# Patient Record
Sex: Female | Born: 1975 | Race: Black or African American | Hispanic: No | Marital: Single | Smoking: Current every day smoker
Health system: Southern US, Community
[De-identification: ages and names within clinical notes are randomized; demographics above are authoritative.]

---

## 2002-11-25 ENCOUNTER — Emergency Department (HOSPITAL_COMMUNITY): Admission: AD | Admit: 2002-11-25 | Discharge: 2002-11-25 | Payer: Self-pay | Admitting: Family Medicine

## 2004-02-27 ENCOUNTER — Emergency Department (HOSPITAL_COMMUNITY): Admission: EM | Admit: 2004-02-27 | Discharge: 2004-02-27 | Payer: Self-pay | Admitting: Family Medicine

## 2005-01-11 ENCOUNTER — Emergency Department (HOSPITAL_COMMUNITY): Admission: EM | Admit: 2005-01-11 | Discharge: 2005-01-11 | Payer: Self-pay | Admitting: Emergency Medicine

## 2005-03-09 ENCOUNTER — Emergency Department (HOSPITAL_COMMUNITY): Admission: EM | Admit: 2005-03-09 | Discharge: 2005-03-09 | Payer: Self-pay | Admitting: Emergency Medicine

## 2005-08-11 ENCOUNTER — Emergency Department (HOSPITAL_COMMUNITY): Admission: EM | Admit: 2005-08-11 | Discharge: 2005-08-11 | Payer: Self-pay | Admitting: Family Medicine

## 2007-02-19 ENCOUNTER — Emergency Department (HOSPITAL_COMMUNITY): Admission: EM | Admit: 2007-02-19 | Discharge: 2007-02-19 | Payer: Self-pay | Admitting: Family Medicine

## 2007-04-29 ENCOUNTER — Emergency Department (HOSPITAL_COMMUNITY): Admission: EM | Admit: 2007-04-29 | Discharge: 2007-04-29 | Payer: Self-pay | Admitting: Emergency Medicine

## 2007-04-30 ENCOUNTER — Emergency Department (HOSPITAL_COMMUNITY): Admission: EM | Admit: 2007-04-30 | Discharge: 2007-04-30 | Payer: Self-pay | Admitting: Family Medicine

## 2007-06-01 ENCOUNTER — Emergency Department (HOSPITAL_COMMUNITY): Admission: EM | Admit: 2007-06-01 | Discharge: 2007-06-01 | Payer: Self-pay | Admitting: Emergency Medicine

## 2007-09-18 ENCOUNTER — Emergency Department (HOSPITAL_COMMUNITY): Admission: EM | Admit: 2007-09-18 | Discharge: 2007-09-18 | Payer: Self-pay | Admitting: Family Medicine

## 2007-12-10 ENCOUNTER — Ambulatory Visit: Payer: Self-pay | Admitting: Family Medicine

## 2007-12-10 ENCOUNTER — Encounter (INDEPENDENT_AMBULATORY_CARE_PROVIDER_SITE_OTHER): Payer: Self-pay | Admitting: Family Medicine

## 2007-12-10 ENCOUNTER — Other Ambulatory Visit: Admission: RE | Admit: 2007-12-10 | Discharge: 2007-12-10 | Payer: Self-pay | Admitting: Family Medicine

## 2007-12-10 ENCOUNTER — Encounter: Payer: Self-pay | Admitting: Family Medicine

## 2007-12-10 DIAGNOSIS — N912 Amenorrhea, unspecified: Secondary | ICD-10-CM | POA: Insufficient documentation

## 2007-12-10 LAB — CONVERTED CEMR LAB
BUN: 18 mg/dL (ref 6–23)
Beta hcg, urine, semiquantitative: NEGATIVE
CO2: 22 meq/L (ref 19–32)
Calcium: 9.1 mg/dL (ref 8.4–10.5)
Chloride: 104 meq/L (ref 96–112)
Cholesterol: 190 mg/dL (ref 0–200)
Creatinine, Ser: 0.76 mg/dL (ref 0.40–1.20)
Glucose, Bld: 95 mg/dL (ref 70–99)
HCT: 37 % (ref 36.0–46.0)
HDL: 80 mg/dL (ref >39)
Hemoglobin: 12 g/dL (ref 12.0–15.0)
LDL Cholesterol: 100 mg/dL — ABNORMAL HIGH (ref 0–99)
MCHC: 32.4 g/dL (ref 30.0–36.0)
MCV: 94.6 fL (ref 78.0–100.0)
Platelets: 277 10*3/uL (ref 150–400)
Potassium: 4.7 meq/L (ref 3.5–5.3)
RBC: 3.91 M/uL (ref 3.87–5.11)
RDW: 14.6 % (ref 11.5–15.5)
Sodium: 136 meq/L (ref 135–145)
Total CHOL/HDL Ratio: 2.4
Triglycerides: 48 mg/dL (ref <150)
VLDL: 10 mg/dL (ref 0–40)
WBC: 8.1 10*3/uL (ref 4.0–10.5)

## 2007-12-11 ENCOUNTER — Telehealth: Payer: Self-pay | Admitting: *Deleted

## 2007-12-11 ENCOUNTER — Encounter: Payer: Self-pay | Admitting: Family Medicine

## 2008-01-01 ENCOUNTER — Ambulatory Visit: Payer: Self-pay | Admitting: Family Medicine

## 2008-01-01 ENCOUNTER — Encounter: Payer: Self-pay | Admitting: Family Medicine

## 2008-01-07 ENCOUNTER — Ambulatory Visit: Payer: Self-pay | Admitting: Family Medicine

## 2008-01-13 ENCOUNTER — Encounter: Payer: Self-pay | Admitting: Family Medicine

## 2008-02-11 ENCOUNTER — Emergency Department (HOSPITAL_COMMUNITY): Admission: EM | Admit: 2008-02-11 | Discharge: 2008-02-11 | Payer: Self-pay | Admitting: Emergency Medicine

## 2008-05-27 ENCOUNTER — Encounter: Payer: Self-pay | Admitting: Family Medicine

## 2008-05-27 ENCOUNTER — Ambulatory Visit: Payer: Self-pay | Admitting: Family Medicine

## 2008-05-27 DIAGNOSIS — K5900 Constipation, unspecified: Secondary | ICD-10-CM | POA: Insufficient documentation

## 2008-05-27 LAB — CONVERTED CEMR LAB

## 2008-05-28 ENCOUNTER — Encounter: Payer: Self-pay | Admitting: Family Medicine

## 2008-05-29 ENCOUNTER — Ambulatory Visit: Payer: Self-pay | Admitting: Family Medicine

## 2008-06-09 ENCOUNTER — Telehealth: Payer: Self-pay | Admitting: Family Medicine

## 2008-06-29 ENCOUNTER — Encounter: Payer: Self-pay | Admitting: Family Medicine

## 2008-06-30 ENCOUNTER — Encounter: Payer: Self-pay | Admitting: Family Medicine

## 2008-06-30 ENCOUNTER — Ambulatory Visit: Payer: Self-pay | Admitting: Family Medicine

## 2008-06-30 LAB — CONVERTED CEMR LAB
Beta hcg, urine, semiquantitative: NEGATIVE
TSH: 1.416 microintl units/mL (ref 0.350–4.500)

## 2008-07-01 ENCOUNTER — Encounter: Payer: Self-pay | Admitting: Family Medicine

## 2008-12-31 ENCOUNTER — Telehealth: Payer: Self-pay | Admitting: Family Medicine

## 2009-01-01 ENCOUNTER — Ambulatory Visit: Payer: Self-pay | Admitting: Family Medicine

## 2009-10-26 ENCOUNTER — Emergency Department (HOSPITAL_COMMUNITY)
Admission: EM | Admit: 2009-10-26 | Discharge: 2009-10-26 | Payer: Self-pay | Source: Home / Self Care | Admitting: Emergency Medicine

## 2009-12-16 ENCOUNTER — Emergency Department (HOSPITAL_COMMUNITY)
Admission: EM | Admit: 2009-12-16 | Discharge: 2009-12-16 | Payer: Self-pay | Source: Home / Self Care | Admitting: Emergency Medicine

## 2010-02-21 ENCOUNTER — Encounter: Payer: Self-pay | Admitting: *Deleted

## 2010-09-24 LAB — DIFFERENTIAL
Basophils Absolute: 0
Basophils Relative: 1
Eosinophils Absolute: 0.2
Eosinophils Relative: 3
Lymphocytes Relative: 24
Lymphs Abs: 1.8
Monocytes Absolute: 0.7
Monocytes Relative: 9
Neutro Abs: 4.9
Neutrophils Relative %: 64

## 2010-09-24 LAB — CBC
HCT: 41.5
Hemoglobin: 13.6
MCHC: 32.8
MCV: 93.4
Platelets: 263
RBC: 4.44
RDW: 14.4
WBC: 7.7

## 2010-09-24 LAB — URIC ACID: Uric Acid, Serum: 4.1

## 2012-02-18 ENCOUNTER — Encounter (HOSPITAL_COMMUNITY): Payer: Self-pay | Admitting: *Deleted

## 2012-02-18 ENCOUNTER — Emergency Department (HOSPITAL_COMMUNITY)
Admission: EM | Admit: 2012-02-18 | Discharge: 2012-02-18 | Disposition: A | Discharge status: Home / Self Care | Payer: Self-pay | Attending: Emergency Medicine | Admitting: Emergency Medicine

## 2012-02-18 DIAGNOSIS — S51009A Unspecified open wound of unspecified elbow, initial encounter: Secondary | ICD-10-CM | POA: Insufficient documentation

## 2012-02-18 DIAGNOSIS — S0990XA Unspecified injury of head, initial encounter: Secondary | ICD-10-CM | POA: Insufficient documentation

## 2012-02-18 DIAGNOSIS — Z79899 Other long term (current) drug therapy: Secondary | ICD-10-CM | POA: Insufficient documentation

## 2012-02-18 DIAGNOSIS — Y929 Unspecified place or not applicable: Secondary | ICD-10-CM | POA: Insufficient documentation

## 2012-02-18 DIAGNOSIS — F17200 Nicotine dependence, unspecified, uncomplicated: Secondary | ICD-10-CM | POA: Insufficient documentation

## 2012-02-18 DIAGNOSIS — W19XXXA Unspecified fall, initial encounter: Secondary | ICD-10-CM

## 2012-02-18 DIAGNOSIS — IMO0002 Reserved for concepts with insufficient information to code with codable children: Secondary | ICD-10-CM

## 2012-02-18 DIAGNOSIS — Y939 Activity, unspecified: Secondary | ICD-10-CM | POA: Insufficient documentation

## 2012-02-18 DIAGNOSIS — R296 Repeated falls: Secondary | ICD-10-CM | POA: Insufficient documentation

## 2012-02-18 DIAGNOSIS — Z23 Encounter for immunization: Secondary | ICD-10-CM | POA: Insufficient documentation

## 2012-02-18 DIAGNOSIS — S0993XA Unspecified injury of face, initial encounter: Secondary | ICD-10-CM | POA: Insufficient documentation

## 2012-02-18 MED ORDER — TETANUS-DIPHTH-ACELL PERTUSSIS 5-2.5-18.5 LF-MCG/0.5 IM SUSP
0.5000 mL | Freq: Once | INTRAMUSCULAR | Status: AC
  Administered 2012-02-18: 0.5 mL via INTRAMUSCULAR
  Filled 2012-02-18: qty 0.5

## 2012-02-18 MED ORDER — HYDROCODONE-ACETAMINOPHEN 5-325 MG PO TABS: 2.0000 | ORAL_TABLET | ORAL | Status: DC | PRN

## 2012-02-18 MED ORDER — CEPHALEXIN 500 MG PO CAPS: 500.0000 mg | ORAL_CAPSULE | Freq: Four times a day (QID) | ORAL | Status: DC

## 2012-02-18 MED ORDER — HYDROCODONE-ACETAMINOPHEN 5-325 MG PO TABS
2.0000 | ORAL_TABLET | Freq: Once | ORAL | Status: AC
  Administered 2012-02-18: 2 via ORAL
  Filled 2012-02-18: qty 2
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
## 2012-02-18 NOTE — ED Notes (Signed)
Patient is alert and oriented x4.  Patient was explained discharge instructions and she understood them with no questions.  Patient is being transported home by her mother.
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
## 2012-02-18 NOTE — ED Provider Notes (Signed)
Medical screening examination/treatment/procedure(s) were performed by non-physician practitioner and as supervising physician I was immediately available for consultation/collaboration.   Mary Blackburn. Oletta Lamas, MD 02/18/12 8657

## 2012-05-11 ENCOUNTER — Emergency Department (HOSPITAL_COMMUNITY): Payer: Self-pay

## 2012-05-11 ENCOUNTER — Emergency Department (HOSPITAL_COMMUNITY)
Admission: EM | Admit: 2012-05-11 | Discharge: 2012-05-11 | Disposition: A | Discharge status: Home / Self Care | Payer: Self-pay | Attending: Emergency Medicine | Admitting: Emergency Medicine

## 2012-05-11 ENCOUNTER — Encounter (HOSPITAL_COMMUNITY): Payer: Self-pay | Admitting: *Deleted

## 2012-05-11 DIAGNOSIS — H9209 Otalgia, unspecified ear: Secondary | ICD-10-CM | POA: Insufficient documentation

## 2012-05-11 DIAGNOSIS — Y929 Unspecified place or not applicable: Secondary | ICD-10-CM | POA: Insufficient documentation

## 2012-05-11 DIAGNOSIS — IMO0002 Reserved for concepts with insufficient information to code with codable children: Secondary | ICD-10-CM | POA: Insufficient documentation

## 2012-05-11 DIAGNOSIS — R42 Dizziness and giddiness: Secondary | ICD-10-CM | POA: Insufficient documentation

## 2012-05-11 DIAGNOSIS — R071 Chest pain on breathing: Secondary | ICD-10-CM | POA: Insufficient documentation

## 2012-05-11 DIAGNOSIS — M542 Cervicalgia: Secondary | ICD-10-CM | POA: Insufficient documentation

## 2012-05-11 DIAGNOSIS — R109 Unspecified abdominal pain: Secondary | ICD-10-CM | POA: Insufficient documentation

## 2012-05-11 DIAGNOSIS — S0003XA Contusion of scalp, initial encounter: Secondary | ICD-10-CM | POA: Insufficient documentation

## 2012-05-11 DIAGNOSIS — R51 Headache: Secondary | ICD-10-CM | POA: Insufficient documentation

## 2012-05-11 DIAGNOSIS — Y9389 Activity, other specified: Secondary | ICD-10-CM | POA: Insufficient documentation

## 2012-05-11 DIAGNOSIS — S0990XA Unspecified injury of head, initial encounter: Secondary | ICD-10-CM

## 2012-05-11 DIAGNOSIS — H538 Other visual disturbances: Secondary | ICD-10-CM | POA: Insufficient documentation

## 2012-05-11 DIAGNOSIS — F17200 Nicotine dependence, unspecified, uncomplicated: Secondary | ICD-10-CM | POA: Insufficient documentation

## 2012-05-11 LAB — POCT I-STAT, CHEM 8
BUN: 10 mg/dL (ref 6–23)
Creatinine, Ser: 1 mg/dL (ref 0.50–1.10)
Glucose, Bld: 82 mg/dL (ref 70–99)
Hemoglobin: 12.9 g/dL (ref 12.0–15.0)
Potassium: 3.4 mEq/L — ABNORMAL LOW (ref 3.5–5.1)
TCO2: 22 mmol/L (ref 0–100)

## 2012-05-11 MED ORDER — IOHEXOL 300 MG/ML  SOLN
100.0000 mL | Freq: Once | INTRAMUSCULAR | Status: AC | PRN
  Administered 2012-05-11: 100 mL via INTRAVENOUS

## 2012-05-11 MED ORDER — HYDROCODONE-ACETAMINOPHEN 5-325 MG PO TABS: 1.0000 | ORAL_TABLET | ORAL | Status: DC | PRN

## 2012-05-11 MED ORDER — HYDROMORPHONE HCL PF 1 MG/ML IJ SOLN
1.0000 mg | Freq: Once | INTRAMUSCULAR | Status: AC
  Administered 2012-05-11: 1 mg via INTRAVENOUS
  Filled 2012-05-11: qty 1

## 2012-05-11 MED ORDER — LORAZEPAM 2 MG/ML IJ SOLN
1.0000 mg | Freq: Once | INTRAMUSCULAR | Status: AC
  Administered 2012-05-11: 1 mg via INTRAVENOUS
  Filled 2012-05-11: qty 1

## 2012-05-11 NOTE — ED Notes (Signed)
Bed:WA24<BR> Expected date:<BR> Expected time:<BR> Means of arrival:<BR> Comments:<BR>

## 2012-05-11 NOTE — ED Notes (Signed)
Patient transported to X-ray 

## 2012-05-11 NOTE — ED Provider Notes (Signed)
  Medical screening examination/treatment/procedure(s) were performed by non-physician practitioner and as supervising physician I was immediately available for consultation/collaboration.    Tanicka Bisaillon, MD 05/11/12 1906 
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
## 2012-06-28 ENCOUNTER — Encounter (HOSPITAL_COMMUNITY): Payer: Self-pay | Admitting: Emergency Medicine

## 2012-06-28 ENCOUNTER — Emergency Department (HOSPITAL_COMMUNITY)
Admission: EM | Admit: 2012-06-28 | Discharge: 2012-06-28 | Disposition: A | Discharge status: Home / Self Care | Payer: Self-pay | Attending: Emergency Medicine | Admitting: Emergency Medicine

## 2012-06-28 DIAGNOSIS — F17200 Nicotine dependence, unspecified, uncomplicated: Secondary | ICD-10-CM | POA: Insufficient documentation

## 2012-06-28 DIAGNOSIS — G8929 Other chronic pain: Secondary | ICD-10-CM | POA: Insufficient documentation

## 2012-06-28 DIAGNOSIS — M25569 Pain in unspecified knee: Secondary | ICD-10-CM | POA: Insufficient documentation

## 2012-06-28 MED ORDER — IBUPROFEN 200 MG PO TABS
600.0000 mg | ORAL_TABLET | Freq: Once | ORAL | Status: AC
  Administered 2012-06-28: 600 mg via ORAL
  Filled 2012-06-28: qty 1

## 2012-06-28 MED ORDER — PREDNISONE 20 MG PO TABS: 40.0000 mg | ORAL_TABLET | Freq: Every day | ORAL | Status: DC

## 2012-06-28 MED ORDER — COLCHICINE 0.6 MG PO TABS
1.2000 mg | ORAL_TABLET | Freq: Once | ORAL | Status: AC
  Administered 2012-06-28: 1.2 mg via ORAL
  Filled 2012-06-28 (×2): qty 2

## 2012-06-28 MED ORDER — IBUPROFEN 600 MG PO TABS: 600.0000 mg | ORAL_TABLET | Freq: Four times a day (QID) | ORAL | Status: AC | PRN

## 2012-06-28 MED ORDER — OXYCODONE-ACETAMINOPHEN 5-325 MG PO TABS: 1.0000 | ORAL_TABLET | ORAL | Status: DC | PRN

## 2012-06-28 NOTE — ED Notes (Signed)
Pt presented with knee pain and multiple joint pains since last night.As per pt this is a chronic problem but has worsen this time.

## 2012-07-03 NOTE — ED Provider Notes (Signed)
   History    37 year old female with bilateral knee pain. Onset last night. Denies,. No fevers or chills. Patient reports a long history of pain in her knees. She is unsure what is acutely worse. Has taken ibuprofen with some mild relief. No fevers or chills. No rash. No acute numbness or tingling.  CSN: [id number] Arrival date & time 06/28/12  1478  First MD Initiated Contact with Patient 06/28/12 [phone]     Chief Complaint  Patient presents with  . Knee Pain   (Consider location/radiation/quality/duration/timing/severity/associated sxs/prior Treatment) HPI History reviewed. No pertinent past medical history. No past surgical history on file. No family history on file. History  Substance Use Topics  . Smoking status: Current Every Day Smoker    Types: Cigarettes  . Smokeless tobacco: Not on file  . Alcohol Use: Yes     Comment: occ   OB History   Grav Para Term Preterm Abortions TAB SAB Ect Mult Living                 Review of Systems  All systems reviewed and negative, other than as noted in HPI.   Allergies  Review of patient's allergies indicates no known allergies.  Home Medications   Current Outpatient Rx  Name  Route  Sig  Dispense  Refill  . ibuprofen (ADVIL,MOTRIN) 200 MG tablet   Oral   Take 200 mg by mouth every 6 (six) hours as needed for pain.         Marland Kitchen ibuprofen (ADVIL,MOTRIN) 600 MG tablet   Oral   Take 1 tablet (600 mg total) by mouth every 6 (six) hours as needed for pain.   30 tablet   0   . oxyCODONE-acetaminophen (PERCOCET/ROXICET) 5-325 MG per tablet   Oral   Take 1-2 tablets by mouth every 4 (four) hours as needed for pain.   15 tablet   0   . predniSONE (DELTASONE) 20 MG tablet   Oral   Take 2 tablets (40 mg total) by mouth daily.   10 tablet   0    BP 118/90  Pulse 84  Temp(Src) 97.5 F (36.4 C) (Oral)  Resp 18  SpO2 100% Physical Exam  Nursing note and vitals reviewed. Constitutional: She appears well-developed and  well-nourished. No distress.  HENT:  Head: Normocephalic and atraumatic.  Eyes: Conjunctivae are normal. Right eye exhibits no discharge. Left eye exhibits no discharge.  Neck: Neck supple.  Cardiovascular: Normal rate, regular rhythm and normal heart sounds.  Exam reveals no gallop and no friction rub.   No murmur heard. Pulmonary/Chest: Effort normal and breath sounds normal. No respiratory distress.  Abdominal: Soft. She exhibits no distension. There is no tenderness.  Musculoskeletal: She exhibits no edema and no tenderness.  Mild tenderness to palpation both knees. Symmetric as compared to each other. No effusion. No skin changes. FROM. NVI distally.   Neurological: She is alert.  Skin: Skin is warm and dry.  Psychiatric: She has a normal mood and affect. Her behavior is normal. Thought content normal.    ED Course  Procedures (including critical care time) Labs Reviewed - No data to display No results found. 1. Knee pain, unspecified laterality     MDM  36yF with chronic knee pain. Denies trauma.  No fever or chills. Physical exam pretty unremarkable. Symptomatic tx. Return precautions discussed. No signs of infection. Doubt labs/imaging of much utility.   Raeford Razor, MD 07/03/12 0120

## 2014-04-15 ENCOUNTER — Encounter (HOSPITAL_COMMUNITY): Payer: Self-pay | Admitting: Emergency Medicine

## 2014-04-15 ENCOUNTER — Emergency Department (HOSPITAL_COMMUNITY)
Admission: EM | Admit: 2014-04-15 | Discharge: 2014-04-15 | Disposition: A | Discharge status: Home / Self Care | Payer: Self-pay | Attending: Emergency Medicine | Admitting: Emergency Medicine

## 2014-04-15 DIAGNOSIS — Z720 Tobacco use: Secondary | ICD-10-CM | POA: Insufficient documentation

## 2014-04-15 DIAGNOSIS — Y998 Other external cause status: Secondary | ICD-10-CM | POA: Insufficient documentation

## 2014-04-15 DIAGNOSIS — S60415A Abrasion of left ring finger, initial encounter: Secondary | ICD-10-CM | POA: Insufficient documentation

## 2014-04-15 DIAGNOSIS — Z7952 Long term (current) use of systemic steroids: Secondary | ICD-10-CM | POA: Insufficient documentation

## 2014-04-15 DIAGNOSIS — Y9289 Other specified places as the place of occurrence of the external cause: Secondary | ICD-10-CM | POA: Insufficient documentation

## 2014-04-15 DIAGNOSIS — Y9389 Activity, other specified: Secondary | ICD-10-CM | POA: Insufficient documentation

## 2014-04-15 DIAGNOSIS — IMO0002 Reserved for concepts with insufficient information to code with codable children: Secondary | ICD-10-CM

## 2014-04-15 DIAGNOSIS — W25XXXA Contact with sharp glass, initial encounter: Secondary | ICD-10-CM | POA: Insufficient documentation

## 2014-04-15 DIAGNOSIS — S61217A Laceration without foreign body of left little finger without damage to nail, initial encounter: Secondary | ICD-10-CM | POA: Insufficient documentation

## 2014-04-15 MED ORDER — LIDOCAINE-EPINEPHRINE (PF) 2 %-1:200000 IJ SOLN: 20.0000 mL | Freq: Once | INTRAMUSCULAR | Status: DC

## 2014-04-15 MED ORDER — BACITRACIN ZINC 500 UNIT/GM EX OINT: 1.0000 "application " | TOPICAL_OINTMENT | Freq: Two times a day (BID) | CUTANEOUS | Status: DC

## 2014-04-15 MED ORDER — TETANUS-DIPHTH-ACELL PERTUSSIS 5-2.5-18.5 LF-MCG/0.5 IM SUSP: 0.5000 mL | Freq: Once | INTRAMUSCULAR | Status: DC

## 2014-04-15 NOTE — ED Notes (Signed)
MD at bedside. 
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
## 2014-04-15 NOTE — ED Notes (Signed)
Pt instructed to
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
## 2014-04-15 NOTE — Discharge Instructions (Signed)
We saw you in the ER for your WOUND. °Please read the instructions provided on wound care. °Keep the area clean and dry, apply bacitracin ointment daily and take the medications provided. °RETURN TO THE ER IF THERE IS INCREASED PAIN, REDNESS, PUS COMING OUT from the wound site. ° ° °Laceration Care, Adult °A laceration is a cut or lesion that goes through all layers of the skin and into the tissue just beneath the skin. °TREATMENT  °Some lacerations may not require closure. Some lacerations may not be able to be closed due to an increased risk of infection. It is important to see your caregiver as soon as possible after an injury to minimize the risk of infection and maximize the opportunity for successful closure. °If closure is appropriate, pain medicines may be given, if needed. The wound will be cleaned to help prevent infection. Your caregiver will use stitches (sutures), staples, wound glue (adhesive), or skin adhesive strips to repair the laceration. These tools bring the skin edges together to allow for faster healing and a better cosmetic outcome. However, all wounds will heal with a scar. Once the wound has healed, scarring can be minimized by covering the wound with sunscreen during the day for 1 full year. °HOME CARE INSTRUCTIONS  °For sutures or staples: °· Keep the wound clean and dry. °· If you were given a bandage (dressing), you should change it at least once a day. Also, change the dressing if it becomes wet or dirty, or as directed by your caregiver. °· Wash the wound with soap and water 2 times a day. Rinse the wound off with water to remove all soap. Pat the wound dry with a clean towel. °· After cleaning, apply a thin layer of the antibiotic ointment as recommended by your caregiver. This will help prevent infection and keep the dressing from sticking. °· You may shower as usual after the first 24 hours. Do not soak the wound in water until the sutures are removed. °· Only take over-the-counter  or prescription medicines for pain, discomfort, or fever as directed by your caregiver. °· Get your sutures or staples removed as directed by your caregiver. °For skin adhesive strips: °· Keep the wound clean and dry. °· Do not get the skin adhesive strips wet. You may bathe carefully, using caution to keep the wound dry. °· If the wound gets wet, pat it dry with a clean towel. °· Skin adhesive strips will fall off on their own. You may trim the strips as the wound heals. Do not remove skin adhesive strips that are still stuck to the wound. They will fall off in time. °For wound adhesive: °· You may briefly wet your wound in the shower or bath. Do not soak or scrub the wound. Do not swim. Avoid periods of heavy perspiration until the skin adhesive has fallen off on its own. After showering or bathing, gently pat the wound dry with a clean towel. °· Do not apply liquid medicine, cream medicine, or ointment medicine to your wound while the skin adhesive is in place. This may loosen the film before your wound is healed. °· If a dressing is placed over the wound, be careful not to apply tape directly over the skin adhesive. This may cause the adhesive to be pulled off before the wound is healed. °· Avoid prolonged exposure to sunlight or tanning lamps while the skin adhesive is in place. Exposure to ultraviolet light in the first year will darken the scar. °·   skin adhesive will usually remain in place for 5 to 10 days, then naturally fall off the skin. Do not pick at the adhesive film. You may need a tetanus shot if:  You cannot remember when you had your last tetanus shot.  You have never had a tetanus shot. If you get a tetanus shot, your arm may swell, get red, and feel warm to the touch. This is common and not a problem. If you need a tetanus shot and you choose not to have one, there is a rare chance of getting tetanus. Sickness from tetanus can be serious. SEEK MEDICAL CARE IF:   You have redness,  swelling, or increasing pain in the wound.  You see a red line that goes away from the wound.  You have yellowish-white fluid (pus) coming from the wound.  You have a fever.  You notice a bad smell coming from the wound or dressing.  Your wound breaks open before or after sutures have been removed.  You notice something coming out of the wound such as wood or glass.  Your wound is on your hand or foot and you cannot move a finger or toe. SEEK IMMEDIATE MEDICAL CARE IF:   Your pain is not controlled with prescribed medicine.  You have severe swelling around the wound causing pain and numbness or a change in color in your arm, hand, leg, or foot.  Your wound splits open and starts bleeding.  You have worsening numbness, weakness, or loss of function of any joint around or beyond the wound.  You develop painful lumps near the wound or on the skin anywhere on your body. MAKE SURE YOU:   Understand these instructions.  Will watch your condition.  Will get help right away if you are not doing well or get worse. Document Released: 12/20/2004 Document Revised: 03/14/2011 Document Reviewed: 06/15/2010 [facility] Patient Information 2015 [employer] [place], [place]. This information is not intended to replace advice given to you by your health care provider. Make sure you discuss any questions you have with your health care provider.

## 2014-05-10 ENCOUNTER — Emergency Department (HOSPITAL_COMMUNITY)
Admission: EM | Admit: 2014-05-10 | Discharge: 2014-05-11 | Disposition: A | Discharge status: Home / Self Care | Payer: Self-pay

## 2014-05-10 ENCOUNTER — Encounter (HOSPITAL_COMMUNITY): Payer: Self-pay | Admitting: *Deleted

## 2014-05-10 DIAGNOSIS — Z791 Long term (current) use of non-steroidal anti-inflammatories (NSAID): Secondary | ICD-10-CM | POA: Insufficient documentation

## 2014-05-10 DIAGNOSIS — Z79899 Other long term (current) drug therapy: Secondary | ICD-10-CM | POA: Insufficient documentation

## 2014-05-10 DIAGNOSIS — R45851 Suicidal ideations: Secondary | ICD-10-CM

## 2014-05-10 DIAGNOSIS — S00511A Abrasion of lip, initial encounter: Secondary | ICD-10-CM | POA: Insufficient documentation

## 2014-05-10 DIAGNOSIS — F1910 Other psychoactive substance abuse, uncomplicated: Secondary | ICD-10-CM

## 2014-05-10 DIAGNOSIS — Y9389 Activity, other specified: Secondary | ICD-10-CM | POA: Insufficient documentation

## 2014-05-10 DIAGNOSIS — T50992A Poisoning by other drugs, medicaments and biological substances, intentional self-harm, initial encounter: Secondary | ICD-10-CM | POA: Insufficient documentation

## 2014-05-10 DIAGNOSIS — F1010 Alcohol abuse, uncomplicated: Secondary | ICD-10-CM | POA: Insufficient documentation

## 2014-05-10 DIAGNOSIS — Z720 Tobacco use: Secondary | ICD-10-CM | POA: Insufficient documentation

## 2014-05-10 DIAGNOSIS — S80212A Abrasion, left knee, initial encounter: Secondary | ICD-10-CM | POA: Insufficient documentation

## 2014-05-10 DIAGNOSIS — S025XXA Fracture of tooth (traumatic), initial encounter for closed fracture: Secondary | ICD-10-CM | POA: Insufficient documentation

## 2014-05-10 DIAGNOSIS — T50902A Poisoning by unspecified drugs, medicaments and biological substances, intentional self-harm, initial encounter: Secondary | ICD-10-CM

## 2014-05-10 DIAGNOSIS — Y998 Other external cause status: Secondary | ICD-10-CM | POA: Insufficient documentation

## 2014-05-10 DIAGNOSIS — Y9289 Other specified places as the place of occurrence of the external cause: Secondary | ICD-10-CM | POA: Insufficient documentation

## 2014-05-10 DIAGNOSIS — F1914 Other psychoactive substance abuse with psychoactive substance-induced mood disorder: Secondary | ICD-10-CM | POA: Insufficient documentation

## 2014-05-10 DIAGNOSIS — F1994 Other psychoactive substance use, unspecified with psychoactive substance-induced mood disorder: Secondary | ICD-10-CM | POA: Diagnosis present

## 2014-05-10 LAB — COMPREHENSIVE METABOLIC PANEL
ALBUMIN: 4.1 g/dL (ref 3.5–5.0)
ALT: 29 U/L (ref 14–54)
ANION GAP: 10 (ref 5–15)
AST: 36 U/L (ref 15–41)
Alkaline Phosphatase: 117 U/L (ref 38–126)
BILIRUBIN TOTAL: 0.2 mg/dL — AB (ref 0.3–1.2)
BUN: 10 mg/dL (ref 6–20)
CHLORIDE: 109 mmol/L (ref 101–111)
CO2: 21 mmol/L — ABNORMAL LOW (ref 22–32)
CREATININE: 0.65 mg/dL (ref 0.44–1.00)
Calcium: 8.9 mg/dL (ref 8.9–10.3)
GFR calc Af Amer: >60 mL/min (ref >60)
GFR calc non Af Amer: >60 mL/min (ref >60)
Glucose, Bld: 94 mg/dL (ref 70–99)
Potassium: 2.9 mmol/L — ABNORMAL LOW (ref 3.5–5.1)
Sodium: 140 mmol/L (ref 135–145)
TOTAL PROTEIN: 8.2 g/dL — AB (ref 6.5–8.1)

## 2014-05-10 LAB — CBC
HEMATOCRIT: 37.1 % (ref 36.0–46.0)
HEMOGLOBIN: 11.9 g/dL — AB (ref 12.0–15.0)
MCH: 28.7 pg (ref 26.0–34.0)
MCHC: 32.1 g/dL (ref 30.0–36.0)
MCV: 89.6 fL (ref 78.0–100.0)
Platelets: 278 10*3/uL (ref 150–400)
RBC: 4.14 MIL/uL (ref 3.87–5.11)
RDW: 14.1 % (ref 11.5–15.5)
WBC: 5.9 10*3/uL (ref 4.0–10.5)

## 2014-05-10 LAB — RAPID URINE DRUG SCREEN, HOSP PERFORMED
Amphetamines: NOT DETECTED
BARBITURATES: NOT DETECTED
Benzodiazepines: POSITIVE — AB
Cocaine: POSITIVE — AB
Opiates: NOT DETECTED
Tetrahydrocannabinol: POSITIVE — AB

## 2014-05-10 LAB — ACETAMINOPHEN LEVEL

## 2014-05-10 LAB — POC URINE PREG, ED: PREG TEST UR: NEGATIVE

## 2014-05-10 LAB — ETHANOL: Alcohol, Ethyl (B): 24 mg/dL — ABNORMAL HIGH (ref <5)

## 2014-05-10 LAB — SALICYLATE LEVEL

## 2014-05-10 MED ORDER — POTASSIUM CHLORIDE CRYS ER 20 MEQ PO TBCR
40.0000 meq | EXTENDED_RELEASE_TABLET | Freq: Once | ORAL | Status: AC
  Administered 2014-05-10: 40 meq via ORAL
  Filled 2014-05-10: qty 2
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
## 2014-05-10 NOTE — ED Notes (Signed)
Patient is alert and cooperative.  She denies SI.  Reports when she took muscle relaxers today she wanted to sleep.

## 2014-05-10 NOTE — BH Assessment (Signed)
Received report from Dr. Madilyn Hookees.  Assessment will be initiated.
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
## 2014-05-10 NOTE — BH Assessment (Signed)
Assessment completed. Consulted Maryjean Mornharles Kober, PA-C who agrees that pt meets inpatient criteria.
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
## 2014-05-10 NOTE — BH Assessment (Addendum)
Tele Assessment Note   Mary Blackburn is an 39 [facility]. [facility]. Pt stated "I took like 4-5 muscle relaxants because I was in pain and I wanted to go to sleep and not wake back up". "I am just stressed out". "I fell and busted my teeth and scrape my knee". Pt reported that she attempted suicide by taking the muscle relaxants. Pt did not report any previous suicide attempts or psychiatric hospitalizations. Pt did not report a mental health history. Pt did not endorse any depressive symptoms; however pt appears depressed. Pt did not report any issues with her sleep or appetite. Pt denied HI and AVH at this time Pt denied having access to weapons or firearms. PT did not report any pending criminal charges or upcoming court dates. Pt reported that she drinks alcohol on the weekend and her last drink was last night. Pt did not report any illicit substance abuse at this time. Pt did not report any physical, sexual or emotional abuse at this time. Pt reported that she has a good support system which includes her mother and Godmother. Inpatient treatment is recommended for psychiatric stabilization.    Axis I: Depressive Disorder NOS  Past Medical History: History reviewed. No pertinent past medical history.  History reviewed. No pertinent past surgical history.  Family History: No family history on file.  Social History:  reports that she has been smoking Cigarettes.  She has been smoking about 0.50 packs per day. She does not have any smokeless tobacco history on file. She reports that she drinks alcohol. She reports that she does not use illicit drugs.  Additional Social History:  Alcohol / Drug Use History of alcohol / drug use?: Yes Substance #1 Name of Substance 1: Alcohol  1 - Age of First Use: 22 1 - Amount (size/oz): varies  1 - Frequency: "weekends" 1 - Duration: ongoing  1 - Last Use / Amount: 05-09-14  CIWA: CIWA-Ar BP: 120/83 mmHg Pulse Rate:  105 COWS:    PATIENT STRENGTHS: (choose at least two) Average or above average intelligence Supportive family/friends  Allergies: No Known Allergies  Home Medications:  (Not in a hospital admission)  OB/GYN Status:  Patient's last menstrual period was 04/04/2014.  General Assessment Data [facility] of [facility]: [facility] Assessment: In system Is this a Tele or Face-to-Face Assessment?: Face-to-Face Is this an Initial Assessment or a Re-assessment for this encounter?: Initial Assessment Marital status: Single Is patient pregnant?: No Pregnancy Status: No Living Arrangements: Parent Can pt return to current living arrangement?: Yes Admission Status: Voluntary Is patient capable of signing voluntary admission?: Yes Referral Source: Self/Family/Friend Insurance type: NA     Crisis Care Plan Living Arrangements: Parent Name of Psychiatrist: No provider reported.  Name of Therapist: No provider reported.   Education Status Is patient currently in school?: No Current Grade: NA Highest grade of school patient has completed: 10 Name of school: NA Contact person: NA  Risk to self with the past 6 months Suicidal Ideation: Yes-Currently Present Has patient been a risk to self within the past 6 months prior to admission? : Yes Suicidal Intent: Yes-Currently Present Has patient had any suicidal intent within the past 6 months prior to admission? : Yes Is patient at risk for suicide?: Yes Suicidal Plan?: Yes-Currently Present Has patient had any suicidal plan within the past 6 months prior to admission? : Yes Specify Current Suicidal Plan: "I took like 4-5 muscle relaxants because I was  in pain and wanted to go to sleep and not wake back up". Access to Means: Yes Specify Access to Suicidal Means: Pt had access to muscle relaxants.  What has been your use of drugs/alcohol within the last 12 months?: Pt reported that she drinks alcohol on the weekend.  Previous  Attempts/Gestures: No How many times?: 0 Other Self Harm Risks: No other self harm risk identified at this time.  Triggers for Past Attempts: None known Intentional Self Injurious Behavior: None Family Suicide History: No Recent stressful life event(s): Other (Comment) ("I fell and busted my teeth and scraped my knee". ) Persecutory voices/beliefs?: No Depression: Yes Depression Symptoms: Despondent Substance abuse history and/or treatment for substance abuse?: Yes Suicide prevention information given to non-admitted patients: Not applicable  Risk to Others within the past 6 months Homicidal Ideation: No Does patient have any lifetime risk of violence toward others beyond the six months prior to admission? : No Thoughts of Harm to Others: No Current Homicidal Intent: No Current Homicidal Plan: No Access to Homicidal Means: No Identified Victim: NA History of harm to others?: No Assessment of Violence: On admission Violent Behavior Description: No violent behaviors observed. Pt is calm and cooperative.  Does patient have access to weapons?: No Criminal Charges Pending?: No Does patient have a court date: No Is patient on probation?: No  Psychosis Hallucinations: None noted Delusions: None noted  Mental Status Report Appearance/Hygiene: In scrubs Eye Contact: Good Motor Activity: Freedom of movement Speech: Logical/coherent, Soft Level of Consciousness: Quiet/awake Mood: Depressed Affect: Depressed Anxiety Level: Minimal Thought Processes: Coherent, Relevant Judgement: Partial Orientation: Appropriate for developmental age Obsessive Compulsive Thoughts/Behaviors: None  Cognitive Functioning Concentration: Decreased Memory: Recent Intact, Remote Intact IQ: Average Insight: Poor Impulse Control: Poor Appetite: Good Weight Loss: 0 Weight Gain: 0 Sleep: No Change Total Hours of Sleep: 8 Vegetative Symptoms: None  ADLScreening [facility] Assessment Services) Patient's  cognitive ability adequate to safely complete daily activities?: Yes Patient able to express need for assistance with ADLs?: Yes Independently performs ADLs?: Yes (appropriate for developmental age)  Prior Inpatient Therapy Prior Inpatient Therapy: No  Prior Outpatient Therapy Prior Outpatient Therapy: No Does patient have an ACCT team?: No Does patient have Intensive In-House Services?  : No Does patient have [facility] services? : No Does patient have P4CC services?: No  ADL Screening (condition at time of admission) Patient's cognitive ability adequate to safely complete daily activities?: Yes Is the patient deaf or have difficulty hearing?: No Does the patient have difficulty seeing, even when wearing glasses/contacts?: No Does the patient have difficulty concentrating, remembering, or making decisions?: No Patient able to express need for assistance with ADLs?: Yes Does the patient have difficulty dressing or bathing?: No Independently performs ADLs?: Yes (appropriate for developmental age)       Abuse/Neglect Assessment (Assessment to be complete while patient is alone) Physical Abuse: Denies Verbal Abuse: Denies Sexual Abuse: Denies Exploitation of patient/patient's resources: Denies Self-Neglect: Denies     Merchant navy officerAdvance Directives (For Healthcare) Does patient have an advance directive?: No Would patient like information on creating an advanced directive?: No - patient declined information    Additional Information 1:1 In Past 12 Months?: No CIRT Risk: No Elopement Risk: No Does patient have medical clearance?: Yes     Disposition: Inpatient treatment Disposition Initial Assessment Completed for this Encounter: Yes  Tsugio Elison S 05/10/2014 11:04 PM

## 2014-05-11 DIAGNOSIS — T50902A Poisoning by unspecified drugs, medicaments and biological substances, intentional self-harm, initial encounter: Secondary | ICD-10-CM

## 2014-05-11 DIAGNOSIS — F1010 Alcohol abuse, uncomplicated: Secondary | ICD-10-CM | POA: Diagnosis present

## 2014-05-11 DIAGNOSIS — R45851 Suicidal ideations: Secondary | ICD-10-CM | POA: Insufficient documentation

## 2014-05-11 DIAGNOSIS — F1994 Other psychoactive substance use, unspecified with psychoactive substance-induced mood disorder: Secondary | ICD-10-CM | POA: Diagnosis present

## 2014-05-11 DIAGNOSIS — F1910 Other psychoactive substance abuse, uncomplicated: Secondary | ICD-10-CM | POA: Diagnosis present

## 2014-05-11 MED ORDER — POTASSIUM CHLORIDE CRYS ER 20 MEQ PO TBCR
40.0000 meq | EXTENDED_RELEASE_TABLET | Freq: Once | ORAL | Status: AC
  Administered 2014-05-11: 40 meq via ORAL
  Filled 2014-05-11: qty 2

## 2014-05-11 NOTE — [facility] Suicide Risk Assessment (Signed)
Suicide Risk Assessment  Discharge Assessment   [facility]   Demographic Factors:  NA  Total Time spent with patient: 45 minutes  Musculoskeletal: Strength & Muscle Tone: within normal limits Gait & Station: normal Patient leans: N/A  Psychiatric Specialty Exam:     Blood pressure 121/83, pulse 90, temperature 98.3 F (36.8 C), temperature source Oral, resp. rate 16, last menstrual period 04/04/2014, SpO2 100 %.There is no weight on file to calculate BMI.  General Appearance: Casual  Eye Contact::  Good  Speech:  Normal Rate  Volume:  Normal  Mood:  Euthymic  Affect:  Congruent  Thought Process:  Coherent  Orientation:  Full (Time, Place, and Person)  Thought Content:  WDL  Suicidal Thoughts:  No  Homicidal Thoughts:  No  Memory:  Immediate;   Good Recent;   Good Remote;   Good  Judgement:  Fair  Insight:  Fair  Psychomotor Activity:  Normal  Concentration:  Good  Recall:  Good  Fund of Knowledge:Good  Language: Good  Akathisia:  No  Handed:  Right  AIMS (if indicated):     Assets:  Communication Skills Desire for Improvement Housing Leisure Time Physical Health Resilience Social Support  Sleep:     Cognition: WNL  ADL's:  Intact      Has this patient used any form of tobacco in the last 30 days? (Cigarettes, Smokeless Tobacco, Cigars, and/or Pipes) Yes, A prescription for an FDA-approved tobacco cessation medication was offered at discharge and the patient refused  Mental Status Per Nursing Assessment::   On Admission:   Alcohol, cocaine abuse  Current Mental Status by Physician: NA  Loss Factors: NA  Historical Factors: NA  Risk Reduction Factors:   Sense of responsibility to family, Employed, Living with another person, especially a relative, Positive social support and Positive coping skills or problem solving skills  Continued Clinical Symptoms:  Non3  Cognitive Features That Contribute To Risk:  None     Suicide Risk:  Minimal: No identifiable suicidal ideation.  Patients presenting with no risk factors but with morbid ruminations; may be classified as minimal risk based on the severity of the depressive symptoms  Principal Problem: Substance induced mood disorder Discharge Diagnoses:  Patient Active Problem List   Diagnosis Date Noted  . Polysubstance abuse [F19.10] 05/11/2014    Priority: High  . Alcohol abuse [F10.10] 05/11/2014    Priority: High  . Substance induced mood disorder [F19.94] 05/11/2014    Priority: High  . CONSTIPATION, MILD [K59.00] 05/27/2008  . Absence of menstruation [N91.2] 12/10/2007      Plan Of Care/Follow-up recommendations:  Activity:  as tolerated Diet:  heart healthy diet  Is patient on multiple antipsychotic therapies at discharge:  No   Has Patient had three or more failed trials of antipsychotic monotherapy by history:  No  Recommended Plan for Multiple Antipsychotic Therapies: NA    LORD, JAMISON, PMH-NP 05/11/2014, 11:51 AM

## 2014-05-11 NOTE — ED Notes (Signed)
Pt. Noted sleeping in room. No complaints or concerns voiced. No distress or abnormal behavior noted. Will continue to monitor with security cameras. Q 15 minute rounds continue. 

## 2014-05-11 NOTE — BH Assessment (Signed)
Inpt recommended. [facility] ref[place] to: [facility] [place], [place], [place], [place], [facility]   Bailey Faiella, [place] Triage Specialist 05/11/2014 3:35 AM

## 2014-05-11 NOTE — Consult Note (Signed)
[place] Psychiatry Consult   Reason for Consult:  Unintentional overdose Referring Physician:  EDP Patient Identification: Mary Blackburn MRN:  [id number] Principal Diagnosis: Substance induced mood disorder Diagnosis:   Patient Active Problem List   Diagnosis Date Noted  . Polysubstance abuse [F19.10] 05/11/2014    Priority: High  . Alcohol abuse [F10.10] 05/11/2014    Priority: High  . Substance induced mood disorder [F19.94] 05/11/2014    Priority: High  . CONSTIPATION, MILD [K59.00] 05/27/2008  . Absence of menstruation [N91.2] 12/10/2007    Total Time spent with patient: 45 minutes  Subjective:   Mary Blackburn is a 39 y.o. female patient has stabilized.  HPI:  The patient had been drinking and using drugs a couple of days ago; fell, scraped her knees and broke her teeth.  She took 4-5 muscle relaxers to help the pain, denies suicidal thoughts or intent.  Denies past suicide attempts or psychiatric history.  Denies suicidal/homicidal ideations, hallucinations.  [place] does drink on the weekends and uses recreational drugs but does not feel she has an issue with dependence.  Stable for discharge. HPI Elements:   Location:  generalized. Quality:  acute. Severity:  mild. Timing:  intemittent. Duration:  brief. Context:  muscle relax use.  Past Medical History: History reviewed. No pertinent past medical history. History reviewed. No pertinent past surgical history. Family History: No family history on file. Social History:  History  Alcohol Use  . Yes    Comment: occ     History  Drug Use No    History   Social History  . Marital Status: Single    Spouse Name: N/A  . Number of Children: N/A  . Years of Education: N/A   Social History Main Topics  . Smoking status: Current Every Mary Smoker -- 0.50 packs/Mary    Types: Cigarettes  . Smokeless tobacco: Not on file  . Alcohol Use: Yes     Comment: occ  . Drug Use: No  . Sexual Activity: Yes   Birth Control/ Protection: None   Other Topics Concern  . None   Social History Narrative   Additional Social History:    History of alcohol / drug use?: Yes Name of Substance 1: Alcohol  1 - Age of First Use: 22 1 - Amount (size/oz): varies  1 - Frequency: "weekends" 1 - Duration: ongoing  1 - Last Use / Amount: 05-09-14                   Allergies:  No Known Allergies  Labs:  Results for orders placed or performed during the hospital encounter of 05/10/14 (from the past 48 hour(s))  CBC     Status: Abnormal   Collection Time: 05/10/14  8:20 PM  Result Value Ref Range   WBC 5.9 4.0 - 10.5 K/uL   RBC 4.14 3.87 - 5.11 MIL/uL   Hemoglobin 11.9 (L) 12.0 - 15.0 g/dL   HCT 37.1 36.0 - 46.0 %   MCV 89.6 78.0 - 100.0 fL   MCH 28.7 26.0 - 34.0 pg   MCHC 32.1 30.0 - 36.0 g/dL   RDW 14.1 11.5 - 15.5 %   Platelets 278 150 - 400 K/uL  Comprehensive metabolic panel     Status: Abnormal   Collection Time: 05/10/14  8:20 PM  Result Value Ref Range   Sodium 140 135 - 145 mmol/L   Potassium 2.9 (L) 3.5 - 5.1 mmol/L   Chloride 109 101 - 111 mmol/L  CO2 21 (L) 22 - 32 mmol/L   Glucose, Bld 94 70 - 99 mg/dL   BUN 10 6 - 20 mg/dL   Creatinine, Ser 0.65 0.44 - 1.00 mg/dL   Calcium 8.9 8.9 - 10.3 mg/dL   Total Protein 8.2 (H) 6.5 - 8.1 g/dL   Albumin 4.1 3.5 - 5.0 g/dL   AST 36 15 - 41 U/L   ALT 29 14 - 54 U/L   Alkaline Phosphatase 117 38 - 126 U/L   Total Bilirubin 0.2 (L) 0.3 - 1.2 mg/dL   GFR calc non Af Amer >60 >60 mL/min   GFR calc Af Amer >60 >60 mL/min    Comment: (NOTE) The eGFR has been calculated using the CKD EPI equation. This calculation has not been validated in all clinical situations. eGFR's persistently <60 mL/min signify possible Chronic Kidney Disease.    Anion gap 10 5 - 15  Ethanol (ETOH)     Status: Abnormal   Collection Time: 05/10/14  8:20 PM  Result Value Ref Range   Alcohol, Ethyl (B) 24 (H) <5 mg/dL    Comment:        LOWEST DETECTABLE  LIMIT FOR SERUM ALCOHOL IS 11 mg/dL FOR MEDICAL PURPOSES ONLY   Acetaminophen level     Status: Abnormal   Collection Time: 05/10/14  8:20 PM  Result Value Ref Range   Acetaminophen (Tylenol), Serum <10 (L) 10 - 30 ug/mL    Comment:        THERAPEUTIC CONCENTRATIONS VARY SIGNIFICANTLY. A RANGE OF 10-30 ug/mL MAY BE AN EFFECTIVE CONCENTRATION FOR MANY PATIENTS. HOWEVER, SOME ARE BEST TREATED AT CONCENTRATIONS OUTSIDE THIS RANGE. ACETAMINOPHEN CONCENTRATIONS >150 ug/mL AT 4 HOURS AFTER INGESTION AND >50 ug/mL AT 12 HOURS AFTER INGESTION ARE OFTEN ASSOCIATED WITH TOXIC REACTIONS.   Salicylate level     Status: None   Collection Time: 05/10/14  8:20 PM  Result Value Ref Range   Salicylate Lvl <8.9 2.8 - 30.0 mg/dL  Urine rapid drug screen (hosp performed)     Status: Abnormal   Collection Time: 05/10/14  8:23 PM  Result Value Ref Range   Opiates NONE DETECTED NONE DETECTED   Cocaine POSITIVE (A) NONE DETECTED   Benzodiazepines POSITIVE (A) NONE DETECTED   Amphetamines NONE DETECTED NONE DETECTED   Tetrahydrocannabinol POSITIVE (A) NONE DETECTED   Barbiturates NONE DETECTED NONE DETECTED    Comment:        DRUG SCREEN FOR MEDICAL PURPOSES ONLY.  IF CONFIRMATION IS NEEDED FOR ANY PURPOSE, NOTIFY LAB WITHIN 5 DAYS.        LOWEST DETECTABLE LIMITS FOR URINE DRUG SCREEN Drug Class       Cutoff (ng/mL) Amphetamine      1000 Barbiturate      200 Benzodiazepine   381 Tricyclics       017 Opiates          300 Cocaine          300 THC              50   POC Urine Pregnancy, ED (if pre-menopausal female)  not at [facility]     Status: None   Collection Time: 05/10/14  8:29 PM  Result Value Ref Range   Preg Test, Ur NEGATIVE NEGATIVE    Comment:        THE SENSITIVITY OF THIS METHODOLOGY IS >24 mIU/mL     Vitals: Blood pressure 121/83, pulse 90, temperature 98.3 F (36.8 C), temperature source Oral,  resp. rate 16, last menstrual period 04/04/2014, SpO2 100 %.  Risk to Self:  Suicidal Ideation: Yes-Currently Present Suicidal Intent: Yes-Currently Present Is patient at risk for suicide?: Yes Suicidal Plan?: Yes-Currently Present Specify Current Suicidal Plan: "I took like 4-5 muscle relaxants because I was in pain and wanted to go to sleep and not wake back up". Access to Means: Yes Specify Access to Suicidal Means: Pt had access to muscle relaxants.  What has been your use of drugs/alcohol within the last 12 months?: Pt reported that she drinks alcohol on the weekend.  How many times?: 0 Other Self Harm Risks: No other self harm risk identified at this time.  Triggers for Past Attempts: None known Intentional Self Injurious Behavior: None Risk to Others: Homicidal Ideation: No Thoughts of Harm to Others: No Current Homicidal Intent: No Current Homicidal Plan: No Access to Homicidal Means: No Identified Victim: NA History of harm to others?: No Assessment of Violence: On admission Violent Behavior Description: No violent behaviors observed. Pt is calm and cooperative.  Does patient have access to weapons?: No Criminal Charges Pending?: No Does patient have a court date: No Prior Inpatient Therapy: Prior Inpatient Therapy: No Prior Outpatient Therapy: Prior Outpatient Therapy: No Does patient have an ACCT team?: No Does patient have Intensive In-House Services?  : No Does patient have [facility] services? : No Does patient have P4CC services?: No  Current Facility-Administered Medications  Medication Dose Route Frequency Provider Last Rate Last Dose  . potassium chloride SA (K-DUR,KLOR-CON) CR tablet 40 mEq  40 mEq Oral Once Patrecia Pour, NP       Current Outpatient Prescriptions  Medication Sig Dispense Refill  . bacitracin ointment Apply 1 application topically 2 (two) times daily. 120 g 0  . ibuprofen (ADVIL,MOTRIN) 200 MG tablet Take 200 mg by mouth every 6 (six) hours as needed for pain.    . naproxen sodium (ANAPROX) 220 MG tablet Take 440 mg by  mouth daily as needed (pain).    Marland Kitchen ibuprofen (ADVIL,MOTRIN) 600 MG tablet Take 1 tablet (600 mg total) by mouth every 6 (six) hours as needed for pain. (Patient not taking: Reported on 04/15/2014) 30 tablet 0  . oxyCODONE-acetaminophen (PERCOCET/ROXICET) 5-325 MG per tablet Take 1-2 tablets by mouth every 4 (four) hours as needed for pain. (Patient not taking: Reported on 04/15/2014) 15 tablet 0  . predniSONE (DELTASONE) 20 MG tablet Take 2 tablets (40 mg total) by mouth daily. (Patient not taking: Reported on 04/15/2014) 10 tablet 0   Musculoskeletal: Strength & Muscle Tone: within normal limits Gait & Station: normal Patient leans: N/A  Psychiatric Specialty Exam:     Blood pressure 121/83, pulse 90, temperature 98.3 F (36.8 C), temperature source Oral, resp. rate 16, last menstrual period 04/04/2014, SpO2 100 %.There is no weight on file to calculate BMI.  General Appearance: Casual  Eye Contact::  Good  Speech:  Normal Rate  Volume:  Normal  Mood:  Euthymic  Affect:  Congruent  Thought Process:  Coherent  Orientation:  Full (Time, Place, and Person)  Thought Content:  WDL  Suicidal Thoughts:  No  Homicidal Thoughts:  No  Memory:  Immediate;   Good Recent;   Good Remote;   Good  Judgement:  Fair  Insight:  Fair  Psychomotor Activity:  Normal  Concentration:  Good  Recall:  Good  Fund of Knowledge:Good  Language: Good  Akathisia:  No  Handed:  Right  AIMS (if indicated):  Assets:  Communication Skills Desire for Improvement Housing Leisure Time Physical Health Resilience Social Support  Sleep:     Cognition: WNL  ADL's:  Intact    Medical Decision Making: Review of Psycho-Social Stressors (1), Review or order clinical lab tests (1) and Review of Medication Regimen & Side Effects (2)  Treatment Plan Summary: Daily contact with patient to assess and evaluate symptoms and progress in treatment, Medication management and Plan discharge home  Plan:  No evidence  of imminent risk to self or others at present.   Disposition: Discharge home  Waylan Boga, [place] 05/11/2014 12:09 PM Patient seen face-to-face for psychiatric evaluation, chart reviewed and case discussed with the physician extender and developed treatment plan. Reviewed the information documented and agree with the treatment plan. Corena Pilgrim, MD

## 2014-05-11 NOTE — ED Notes (Signed)
Written dc instructions reviewed w/ pt.

## 2014-05-11 NOTE — ED Notes (Signed)
Pt encouraged not to combine drug use with alcohol, to follow up as needed, and return/seek help for suicidal/homicidal thoughts or urges.  Pt verbalized understanding.  Pt ambulatory w/o difficulty to dc window w/ mHt.  Belongings returned after leaving the area.

## 2014-05-11 NOTE — BH Assessment (Signed)
Spoke with pt about treatment recommendation.  [facility]  because "[facility]". This Clinical research associatewriter explained to pt that she reported a suicide attempt and she will need inpatient treatment. Pt was on the phone with her sister who told pt to tell this writer that [facility] and [facility] did not have to [facility]. This Clinical research associatewriter explained to pt and her sister that treatment recommendation was due to pt's reported suicide attempt and concern for her safety. This Clinical research associatewriter also informed pt and sister that if pt was unwilling to voluntarily sign herself into treatment the courts will petitioned for involuntary commitment. Pt sister stated "she really wasn't trying to commit suicide she was just trying to relax her muscles". This Clinical research associatewriter thanked pt and her sister for their time and left the room.

## 2014-05-11 NOTE — BH Assessment (Signed)
Informed Dr. Madilyn Hookees of the treatment recommendation and shared with her that pt has been straddling the fence about treatment. Dr Madilyn Hookees recommended that pt should be IVC'd if she attempt to leave.

## 2014-05-11 NOTE — ED Notes (Signed)
Pt. To [facility] from ED ambulatory without difficulty, to room 35 . [facility]. Pt. Is alert and oriented, warm and dry in no distress. Pt. Tearfull. Pt. Made aware of security cameras and Q15 minute rounds. Pt. Encouraged to let Nursing staff know of any concerns or needs.

## 2014-05-11 NOTE — ED Notes (Signed)
Up to the desk to call for ride 

## 2015-03-07 ENCOUNTER — Encounter (HOSPITAL_COMMUNITY): Payer: Self-pay | Admitting: Emergency Medicine

## 2015-03-07 ENCOUNTER — Emergency Department (HOSPITAL_COMMUNITY)
Admission: EM | Admit: 2015-03-07 | Discharge: 2015-03-07 | Disposition: A | Discharge status: Home / Self Care | Payer: Self-pay | Attending: Emergency Medicine | Admitting: Emergency Medicine

## 2015-03-07 DIAGNOSIS — K0889 Other specified disorders of teeth and supporting structures: Secondary | ICD-10-CM | POA: Insufficient documentation

## 2015-03-07 DIAGNOSIS — F17210 Nicotine dependence, cigarettes, uncomplicated: Secondary | ICD-10-CM | POA: Insufficient documentation

## 2015-03-07 DIAGNOSIS — Z792 Long term (current) use of antibiotics: Secondary | ICD-10-CM | POA: Insufficient documentation

## 2015-03-07 DIAGNOSIS — R2232 Localized swelling, mass and lump, left upper limb: Secondary | ICD-10-CM | POA: Insufficient documentation

## 2015-03-07 MED ORDER — HYDROCODONE-ACETAMINOPHEN 5-325 MG PO TABS: 1.0000 | ORAL_TABLET | Freq: Four times a day (QID) | ORAL | Status: DC | PRN

## 2015-03-07 MED ORDER — HYDROCODONE-ACETAMINOPHEN 5-325 MG PO TABS
2.0000 | ORAL_TABLET | Freq: Once | ORAL | Status: AC
  Administered 2015-03-07: 2 via ORAL
  Filled 2015-03-07: qty 2

## 2015-03-07 MED ORDER — PENICILLIN V POTASSIUM 500 MG PO TABS
500.0000 mg | ORAL_TABLET | Freq: Four times a day (QID) | ORAL | Status: AC
Start: 2015-03-07 — End: 2015-03-14

## 2015-03-07 NOTE — Discharge Instructions (Signed)
Ms. Melton KrebsVictoria T Galli,  Nice meeting you! Please follow-up with your dentist. Return to the emergency department if you develop fevers, chills, shortness of breath, or do not improve. Feel better soon!  S. Lane HackerNicole Cadence Minton, PA-C

## 2015-03-07 NOTE — ED Notes (Signed)
Pt here with abscess in mouth. No signs of infection, drainage.

## 2015-03-07 NOTE — ED Provider Notes (Signed)
CSN: [id number]     Arrival date & time 03/07/15  1850 History   First MD Initiated Contact with Patient 03/07/15 1903     Chief Complaint  Patient presents with  . Dental Pain   HPI   CYNDI MONTEJANO is a 40 y.o. female with no significant PMH presenting with a 2 day history of dental pain. She denies fevers, chills, shortness of breath, nausea, vomiting, history of this previously or known dental infection.  History reviewed. No pertinent past medical history. History reviewed. No pertinent past surgical history. History reviewed. No pertinent family history. Social History  Substance Use Topics  . Smoking status: Current Every Day Smoker -- 0.50 packs/day    Types: Cigarettes  . Smokeless tobacco: None  . Alcohol Use: Yes     Comment: occ   OB History    No data available     Review of Systems  Ten systems are reviewed and are negative for acute change except as noted in the HPI  Allergies  Review of patient's allergies indicates no known allergies.  Home Medications   Prior to Admission medications   Medication Sig Start Date End Date Taking? Authorizing Provider  bacitracin ointment Apply 1 application topically 2 (two) times daily. 04/15/14   Derwood Kaplan, MD  ibuprofen (ADVIL,MOTRIN) 200 MG tablet Take 200 mg by mouth every 6 (six) hours as needed for pain.    Historical Provider, MD  ibuprofen (ADVIL,MOTRIN) 600 MG tablet Take 1 tablet (600 mg total) by mouth every 6 (six) hours as needed for pain. Patient not taking: Reported on 04/15/2014 06/28/12   Raeford Razor, MD  naproxen sodium (ANAPROX) 220 MG tablet Take 440 mg by mouth daily as needed (pain).    Historical Provider, MD  oxyCODONE-acetaminophen (PERCOCET/ROXICET) 5-325 MG per tablet Take 1-2 tablets by mouth every 4 (four) hours as needed for pain. Patient not taking: Reported on 04/15/2014 06/28/12   Raeford Razor, MD  predniSONE (DELTASONE) 20 MG tablet Take 2 tablets (40 mg total) by mouth daily. Patient  not taking: Reported on 04/15/2014 06/28/12   Raeford Razor, MD   BP 171/118 mmHg  Pulse 80  Temp(Src) 98.8 F (37.1 C) (Oral)  Resp 16  Ht  (1.651 m)  Wt 74.844 kg  BMI 27.46 kg/m2  SpO2 97%  LMP 03/07/2015 (Exact Date) Physical Exam  Constitutional: She appears well-developed and well-nourished. No distress.  HENT:  Head: Normocephalic and atraumatic.  Right Ear: External ear normal.  Left Ear: External ear normal.  Nose: Nose normal.  Mouth/Throat: Oropharynx is clear and moist. No oropharyngeal exudate.  Palpable 1.5 cm mass overlying left maxilla. No sign of cellulitis, drainage. Airway intact.  Eyes: Conjunctivae are normal. Pupils are equal, round, and reactive to light. Right eye exhibits no discharge. Left eye exhibits no discharge. No scleral icterus.  Neck: No tracheal deviation present.  Cardiovascular: Normal rate, regular rhythm, normal heart sounds and intact distal pulses.  Exam reveals no gallop and no friction rub.   No murmur heard. Pulmonary/Chest: Effort normal and breath sounds normal. No respiratory distress. She has no wheezes. She has no rales. She exhibits no tenderness.  Abdominal: Soft. Bowel sounds are normal. She exhibits no distension and no mass. There is no tenderness. There is no rebound and no guarding.  Musculoskeletal: She exhibits no edema.  Lymphadenopathy:    She has no cervical adenopathy.  Neurological: She is alert. Coordination normal.  Skin: Skin is warm and dry. No rash  noted. She is not diaphoretic. No erythema.  Psychiatric: She has a normal mood and affect. Her behavior is normal.  Nursing note and vitals reviewed.   ED Course  Procedures   MDM   Final diagnoses:  Pain, dental   Patient with toothache.  No gross abscess.  Exam unconcerning for Ludwig's angina or spread of infection.  Will treat with penicillin and pain medicine.  Urged patient to follow-up with dentist.     Melton KrebsSamantha Nicole Brynleigh Sequeira, PA-C 03/11/15  [id number]  Geoffery Lyonsouglas Delo, MD 03/12/15 2256

## 2015-03-07 NOTE — ED Notes (Signed)
See PA assessment 

## 2015-03-07 NOTE — ED Notes (Signed)
Pt ambulates independently and with steady gait at time of discharge. Discharge instructions and follow up information reviewed with patient. No other questions or concerns voiced at this time. RX x 2 given. 

## 2017-10-19 ENCOUNTER — Other Ambulatory Visit: Payer: Self-pay

## 2017-10-19 ENCOUNTER — Emergency Department (HOSPITAL_COMMUNITY): Payer: Self-pay

## 2017-10-19 ENCOUNTER — Encounter (HOSPITAL_COMMUNITY): Payer: Self-pay | Admitting: Emergency Medicine

## 2017-10-19 ENCOUNTER — Emergency Department (HOSPITAL_COMMUNITY)
Admission: EM | Admit: 2017-10-19 | Discharge: 2017-10-19 | Disposition: A | Discharge status: Home / Self Care | Payer: Self-pay | Attending: Emergency Medicine | Admitting: Emergency Medicine

## 2017-10-19 DIAGNOSIS — S63284A Dislocation of proximal interphalangeal joint of right ring finger, initial encounter: Secondary | ICD-10-CM | POA: Insufficient documentation

## 2017-10-19 DIAGNOSIS — F17210 Nicotine dependence, cigarettes, uncomplicated: Secondary | ICD-10-CM | POA: Insufficient documentation

## 2017-10-19 DIAGNOSIS — Y929 Unspecified place or not applicable: Secondary | ICD-10-CM | POA: Insufficient documentation

## 2017-10-19 DIAGNOSIS — S63289A Dislocation of proximal interphalangeal joint of unspecified finger, initial encounter: Secondary | ICD-10-CM

## 2017-10-19 DIAGNOSIS — Y9302 Activity, running: Secondary | ICD-10-CM | POA: Insufficient documentation

## 2017-10-19 DIAGNOSIS — W010XXA Fall on same level from slipping, tripping and stumbling without subsequent striking against object, initial encounter: Secondary | ICD-10-CM | POA: Insufficient documentation

## 2017-10-19 DIAGNOSIS — Z79899 Other long term (current) drug therapy: Secondary | ICD-10-CM | POA: Insufficient documentation

## 2017-10-19 DIAGNOSIS — Y999 Unspecified external cause status: Secondary | ICD-10-CM | POA: Insufficient documentation

## 2017-10-19 MED ORDER — LIDOCAINE HCL 2 % IJ SOLN
10.0000 mL | Freq: Once | INTRAMUSCULAR | Status: AC
  Administered 2017-10-19: 200 mg
  Filled 2017-10-19: qty 20

## 2017-10-19 MED ORDER — OXYCODONE HCL 5 MG PO TABS
5.0000 mg | ORAL_TABLET | Freq: Once | ORAL | Status: AC
  Administered 2017-10-19: 5 mg via ORAL
  Filled 2017-10-19: qty 1

## 2017-10-19 NOTE — ED Provider Notes (Addendum)
[facility] EMERGENCY DEPARTMENT Provider Note   CSN: [id number] Arrival date & time: 10/19/17  1715     History   Chief Complaint Chief Complaint  Patient presents with  . Finger Injury    HPI Mary Blackburn is a 42 y.o. female presenting the emergency department with complaint of acute right 4th finger injury that occurred prior to arrival.  Patient states she was trying to get away from her dog and tripped and fell, jamming her finger on the ground.  She thinks it was jammed in flexion and has pain at the proximal interphalangeal joint with associated swelling.  She states it is also deformed.  She denies numbness to the distal finger.  Denies wounds.  Took Tylenol prior to arrival.  She is right-hand dominant.  The history is provided by the patient.    History reviewed. No pertinent past medical history.  Patient Active Problem List   Diagnosis Date Noted  . Polysubstance abuse (HCC) 05/11/2014  . Alcohol abuse 05/11/2014  . Substance induced mood disorder (HCC) 05/11/2014  . Suicidal thoughts   . Suicide attempt by drug ingestion (HCC)   . CONSTIPATION, MILD 05/27/2008  . Absence of menstruation 12/10/2007    History reviewed. No pertinent surgical history.   OB History   None      Home Medications    Prior to Admission medications   Medication Sig Start Date End Date Taking? Authorizing Provider  bacitracin ointment Apply 1 application topically 2 (two) times daily. 04/15/14   Derwood Kaplan, MD  HYDROcodone-acetaminophen (NORCO/VICODIN) 5-325 MG tablet Take 1-2 tablets by mouth every 6 (six) hours as needed. 03/07/15   [name], PA-C  ibuprofen (ADVIL,MOTRIN) 200 MG tablet Take 200 mg by mouth every 6 (six) hours as needed for pain.    [provider]  ibuprofen (ADVIL,MOTRIN) 600 MG tablet Take 1 tablet (600 mg total) by mouth every 6 (six) hours as needed for pain. Patient not taking: Reported on 04/15/2014 06/28/12    Raeford Razor, MD  naproxen sodium (ANAPROX) 220 MG tablet Take 440 mg by mouth daily as needed (pain).    [provider]  oxyCODONE-acetaminophen (PERCOCET/ROXICET) 5-325 MG per tablet Take 1-2 tablets by mouth every 4 (four) hours as needed for pain. Patient not taking: Reported on 04/15/2014 06/28/12   Raeford Razor, MD  predniSONE (DELTASONE) 20 MG tablet Take 2 tablets (40 mg total) by mouth daily. Patient not taking: Reported on 04/15/2014 06/28/12   Raeford Razor, MD    Family History No family history on file.  Social History Social History   Tobacco Use  . Smoking status: Current Every Day Smoker    Packs/day: 0.50    Types: Cigarettes  Substance Use Topics  . Alcohol use: Yes    Comment: occ  . Drug use: No     Allergies   Patient has no known allergies.   Review of Systems Review of Systems  Musculoskeletal: Positive for arthralgias and joint swelling.  Skin: Negative for wound.  Neurological: Negative for numbness.     Physical Exam Updated Vital Signs BP (!) 127/107 (BP Location: Right Arm)   Pulse 100   Temp 98.8 F (37.1 C) (Oral)   Resp 16   SpO2 100%   Physical Exam  Constitutional: She appears well-developed and well-nourished. No distress.  HENT:  Head: Normocephalic and atraumatic.  Eyes: Conjunctivae are normal.  Cardiovascular: Normal rate and intact distal pulses.  Pulmonary/Chest: Effort normal.  Musculoskeletal:  Right fourth digit with swelling at the proximal interphalangeal joint with associated tenderness.  There is volar deformity..  Finger is held in flexion and unable to range secondary to pain.  No pain to the DIP joint.  Normal sensation to distal digit.  No wounds.  No tenderness to the MCP joint or hand.  Psychiatric: She has a normal mood and affect. Her behavior is normal.  Nursing note and vitals reviewed.    ED Treatments / Results  Labs (all labs ordered are listed, but only abnormal results are  displayed) Labs Reviewed - No data to display  EKG None  Radiology Dg Hand Complete Right  Result Date: 10/19/2017 CLINICAL DATA:  Ran into wall. Right ring finger injury with pain and swelling. Initial encounter. EXAM: RIGHT HAND - COMPLETE 3+ VIEW COMPARISON:  None. FINDINGS: Volar dislocation of the ring finger is seen at the PIP joint. No fracture identified. IMPRESSION: Volar dislocation of ring finger PIP joint. No evidence of fracture. Electronically Signed   By: Myles Rosenthal M.D.   On: 10/19/2017 18:55    Procedures Reduction of dislocation Date/Time: 10/19/2017 7:23 PM Performed by: Robinson, [place] N, PA-C Authorized by: Robinson, [place] N, PA-C  Consent: Verbal consent obtained. Risks and benefits: risks, benefits and alternatives were discussed Consent given by: patient Patient understanding: patient states understanding of the procedure being performed Imaging studies: imaging studies available Required items: required blood products, implants, devices, and special equipment available Patient identity confirmed: verbally with patient Local anesthesia used: yes Anesthesia: digital block  Anesthesia: Local anesthesia used: yes Local Anesthetic: lidocaine 2% without epinephrine Anesthetic total: 4 mL  Sedation: Patient sedated: no  Patient tolerance: Patient tolerated the procedure well with no immediate complications Comments: Applied steady traction in volar direction with PIP joint in flexion. Joint reduced without difficulty to normal anatomical alignment. Finger ranged with normal flexion and extension. Distal digit warm and well perfused following reduction. Finger splinted in extension.     (including critical care time)  Medications Ordered in ED Medications  oxyCODONE (Oxy IR/ROXICODONE) immediate release tablet 5 mg (5 mg Oral Given 10/19/17 1825)  lidocaine (XYLOCAINE) 2 % (with pres) injection 200 mg (200 mg Infiltration Given 10/19/17 1905)      Initial Impression / Assessment and Plan / ED Course  I have reviewed the triage vital signs and the nursing notes.  Pertinent labs & imaging results that were available during my care of the patient were reviewed by me and considered in my medical decision making (see chart for details).     Patient with volar dislocation of proximal interphalangeal joint of the right fourth digit.  No fracture evident on x-ray. Digital block used for anesthesia with complete block achieved.   Neurovascularly intact prior to reduction and with warm well perfused digit following reduction. Following reduction, normal anatomic alignment of digit and normal active flexion, limited active extension, likely 2/t central slip injury, though able to passively extend without issue. Patient tolerated procedure well. Finger splinted in extension. Discussed treatment, finger to remain in extension until follow up with hand specialist, then per hand specialist recommendation. Will not prescribe narcotics given pts hx of polysubstance abuse. Recommend RICE and NSAIDs. Agreeable to plan and safe for discharge.  Discussed results, findings, treatment and follow up. Patient advised of return precautions. Patient verbalized understanding and agreed with plan.  Final Clinical Impressions(s) / ED Diagnoses   Final diagnoses:  Dislocation of PIP joint of finger, initial encounter  ED Discharge Orders    None       Robinson, [place] N, PA-C 10/19/17 1945    Tegeler, Canary Brim, MD 10/20/17 0044    Robinson, [place] N, PA-C 10/20/17 0134    Tegeler, Canary Brim, MD 10/20/17 [phone]

## 2017-10-19 NOTE — ED Triage Notes (Signed)
Pt was "running from a dog" and tripped and tried to catch self, injury finger. Left ring finger is unable to bend and painful at joints. Good sensation present. Patient took tylenol PTO

## 2017-10-19 NOTE — Discharge Instructions (Addendum)
Please read instructions below. Keep the splint on at all times.  Keep it dry and clean. Apply ice to your finger for 20 minutes at a time. Elevate it as much as possible. You can take 600mg  of ibuprofen every 6 hours as needed for pain. Schedule an appointment with the Orthopedic hand specialist in 1 week for follow-up on your injury and to ensure proper healing. Return to the ER for new or concerning symptoms.

## 2018-06-18 ENCOUNTER — Encounter (HOSPITAL_COMMUNITY): Payer: Self-pay

## 2018-06-18 ENCOUNTER — Emergency Department (HOSPITAL_COMMUNITY)
Admission: EM | Admit: 2018-06-18 | Discharge: 2018-06-18 | Disposition: A | Discharge status: Home / Self Care | Payer: No Typology Code available for payment source | Attending: Emergency Medicine | Admitting: Emergency Medicine

## 2018-06-18 ENCOUNTER — Emergency Department (HOSPITAL_COMMUNITY): Payer: No Typology Code available for payment source

## 2018-06-18 ENCOUNTER — Other Ambulatory Visit: Payer: Self-pay

## 2018-06-18 DIAGNOSIS — M545 Low back pain, unspecified: Secondary | ICD-10-CM

## 2018-06-18 DIAGNOSIS — Y9389 Activity, other specified: Secondary | ICD-10-CM | POA: Insufficient documentation

## 2018-06-18 DIAGNOSIS — Y998 Other external cause status: Secondary | ICD-10-CM | POA: Insufficient documentation

## 2018-06-18 DIAGNOSIS — F17210 Nicotine dependence, cigarettes, uncomplicated: Secondary | ICD-10-CM | POA: Insufficient documentation

## 2018-06-18 DIAGNOSIS — Y92410 Unspecified street and highway as the place of occurrence of the external cause: Secondary | ICD-10-CM | POA: Insufficient documentation

## 2018-06-18 LAB — POC URINE PREG, ED: Preg Test, Ur: NEGATIVE

## 2018-06-18 MED ORDER — ACETAMINOPHEN 500 MG PO TABS
1000.0000 mg | ORAL_TABLET | Freq: Once | ORAL | Status: AC
  Administered 2018-06-18: 1000 mg via ORAL
  Filled 2018-06-18: qty 2

## 2018-06-18 MED ORDER — METHOCARBAMOL 500 MG PO TABS: 500.0000 mg | ORAL_TABLET | Freq: Two times a day (BID) | ORAL | 0 refills | Status: DC
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
## 2018-06-18 NOTE — Discharge Instructions (Signed)

## 2018-06-18 NOTE — ED Notes (Signed)
Patient is questioning the wait. Informed patient that everything is back and awaiting provider.

## 2019-11-19 ENCOUNTER — Other Ambulatory Visit: Payer: Self-pay

## 2019-11-19 ENCOUNTER — Ambulatory Visit (HOSPITAL_COMMUNITY)
Admission: EM | Admit: 2019-11-19 | Discharge: 2019-11-19 | Disposition: A | Discharge status: Home / Self Care | Payer: HRSA Program | Attending: Family Medicine | Admitting: Family Medicine

## 2019-11-19 ENCOUNTER — Encounter (HOSPITAL_COMMUNITY): Payer: Self-pay | Admitting: Emergency Medicine

## 2019-11-19 DIAGNOSIS — F17210 Nicotine dependence, cigarettes, uncomplicated: Secondary | ICD-10-CM | POA: Diagnosis not present

## 2019-11-19 DIAGNOSIS — Z20822 Contact with and (suspected) exposure to covid-19: Secondary | ICD-10-CM | POA: Diagnosis not present

## 2019-11-19 DIAGNOSIS — J069 Acute upper respiratory infection, unspecified: Secondary | ICD-10-CM | POA: Insufficient documentation

## 2019-11-19 NOTE — Discharge Instructions (Signed)
You have been tested for COVID-19 today. °If your test returns positive, you will receive a phone call from [facility] regarding your results. °Negative test results are not called. °Both positive and negative results area always visible on MyChart. °If you do not have a MyChart account, sign up instructions are provided in your discharge papers. °Please do not hesitate to contact us should you have questions or concerns. ° °

## 2019-11-19 NOTE — ED Triage Notes (Signed)
Chills , cough, headache, loss of smell since 3 days ago

## 2019-11-19 NOTE — ED Provider Notes (Signed)
[facility]   [id number] 11/19/19 Arrival Time: 1248  ASSESSMENT & PLAN:  1. Viral URI with cough     COVID-19 testing sent. See letter/work note on file for self-isolation guidelines. OTC symptom care as needed.    Follow-up Information    [facility].   Specialty: Urgent Care Why: As needed. Contact information: [address] [place] [address] [phone]              Reviewed expectations re: course of current medical issues. Questions answered. Outlined signs and symptoms indicating need for more acute intervention. Understanding verbalized. After Visit Summary given.   SUBJECTIVE: History from: patient. Mary Blackburn is a 44 y.o. female who presents with worries regarding COVID-19. Known COVID-19 contact: reports recent exposure. Recent travel: none. Reports: cough and congestion for 2-3 days. Denies: fever and difficulty breathing. Normal PO intake without n/v/d.    OBJECTIVE:  Vitals:   11/19/19 1320  BP: (!) 141/90  Pulse: 88  Resp: 20  Temp: 97.8 F (36.6 C)  TempSrc: Oral  SpO2: 100%    General appearance: alert; no distress Eyes: PERRLA; EOMI; conjunctiva normal HENT: [place]; AT; with nasal congestion Neck: supple  Lungs: speaks full sentences without difficulty; unlabored; dry cough Extremities: no edema Skin: warm and dry Neurologic: normal gait Psychological: alert and cooperative; normal mood and affect  Labs:  Labs Reviewed  SARS CORONAVIRUS 2 (TAT 6-24 HRS)     No Known Allergies  History reviewed. No pertinent past medical history. Social History   Socioeconomic History   Marital status: Single    Spouse name: Not on file   Number of children: Not on file   Years of education: Not on file   Highest education level: Not on file  Occupational History   Not on file  Tobacco Use   Smoking status: Current Every Day Smoker    Packs/day: 0.50    Types:  Cigarettes   Smokeless tobacco: Never Used  Substance and Sexual Activity   Alcohol use: Yes    Comment: occ   Drug use: No   Sexual activity: Yes    Birth control/protection: None  Other Topics Concern   Not on file  Social History Narrative   Not on file   Social Determinants of Health   Financial Resource Strain:    Difficulty of Paying Living Expenses: Not on file  Food Insecurity:    Worried About Running Out of Food in the Last Year: Not on file   [employer] of Food in the Last Year: Not on file  Transportation Needs:    Lack of Transportation (Medical): Not on file   Lack of Transportation (Non-Medical): Not on file  Physical Activity:    Days of Exercise per Week: Not on file   Minutes of Exercise per Session: Not on file  Stress:    Feeling of Stress : Not on file  Social Connections:    Frequency of Communication with Friends and Family: Not on file   Frequency of Social Gatherings with Friends and Family: Not on file   Attends Religious Services: Not on file   Active Member of Clubs or Organizations: Not on file   Attends Banker Meetings: Not on file   Marital Status: Not on file  Intimate Partner Violence:    Fear of Current or Ex-Partner: Not on file   Emotionally Abused: Not on file   Physically Abused: Not on file  Sexually Abused: Not on file   No family history on file. History reviewed. No pertinent surgical history.   Mardella Layman, MD 11/19/19 1357

## 2019-11-20 LAB — SARS CORONAVIRUS 2 (TAT 6-24 HRS): SARS Coronavirus 2: NEGATIVE
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
